# Patient Record
Sex: Female | Born: 1960 | Race: White | Hispanic: No | Smoking: Former smoker
Health system: Southern US, Community
[De-identification: ages and names within clinical notes are randomized; demographics above are authoritative.]

## PROBLEM LIST (undated history)

## (undated) DIAGNOSIS — E079 Disorder of thyroid, unspecified: Secondary | ICD-10-CM

## (undated) HISTORY — PX: TUBAL LIGATION: SHX77

## (undated) HISTORY — PX: CHOLECYSTECTOMY: SHX55

## (undated) HISTORY — PX: TONSILLECTOMY: SUR1361

---

## 1997-11-17 ENCOUNTER — Other Ambulatory Visit: Admission: RE | Admit: 1997-11-17 | Discharge: 1997-11-17 | Payer: Self-pay | Admitting: Obstetrics and Gynecology

## 2002-02-06 ENCOUNTER — Emergency Department (HOSPITAL_COMMUNITY): Admission: EM | Admit: 2002-02-06 | Discharge: 2002-02-06 | Payer: Self-pay | Admitting: *Deleted

## 2004-10-06 ENCOUNTER — Emergency Department: Payer: Self-pay | Admitting: Unknown Physician Specialty

## 2004-12-07 ENCOUNTER — Other Ambulatory Visit: Payer: Self-pay

## 2004-12-07 ENCOUNTER — Emergency Department: Payer: Self-pay | Admitting: General Practice

## 2010-02-25 ENCOUNTER — Emergency Department (HOSPITAL_COMMUNITY): Admission: EM | Admit: 2010-02-25 | Discharge: 2010-02-25 | Payer: Self-pay | Admitting: Internal Medicine

## 2010-03-15 ENCOUNTER — Ambulatory Visit (HOSPITAL_COMMUNITY): Admission: RE | Admit: 2010-03-15 | Discharge: 2010-03-15 | Payer: Self-pay | Admitting: Surgery

## 2010-08-04 LAB — DIFFERENTIAL
Basophils Relative: 1 % (ref 0–1)
Eosinophils Absolute: 0.2 10*3/uL (ref 0.0–0.7)
Monocytes Absolute: 0.5 10*3/uL (ref 0.1–1.0)
Monocytes Relative: 6 % (ref 3–12)

## 2010-08-04 LAB — BASIC METABOLIC PANEL
CO2: 28 mEq/L (ref 19–32)
Glucose, Bld: 93 mg/dL (ref 70–99)
Potassium: 3.7 mEq/L (ref 3.5–5.1)
Sodium: 137 mEq/L (ref 135–145)

## 2010-08-04 LAB — CBC
HCT: 31.5 % — ABNORMAL LOW (ref 36.0–46.0)
Hemoglobin: 11.8 g/dL — ABNORMAL LOW (ref 12.0–15.0)
Hemoglobin: 10.1 g/dL — ABNORMAL LOW (ref 12.0–15.0)
MCH: 26.4 pg (ref 26.0–34.0)
MCH: 25.8 pg — ABNORMAL LOW (ref 26.0–34.0)
MCHC: 32.1 g/dL (ref 30.0–36.0)
MCV: 79.2 fL (ref 78.0–100.0)
MCV: 80.6 fL (ref 78.0–100.0)
RBC: 4.49 MIL/uL (ref 3.87–5.11)

## 2010-10-08 NOTE — Consult Note (Signed)
NAME:  Olivia Peterson, GUARDIOLA NO.:  [id number]   MEDICAL RECORD NO.:  [id number]                   PATIENT TYPE:  EMS   LOCATION:  [facility]                                 FACILITY:  [facility]   PHYSICIAN:  Marlan Palau, M.D.               DATE OF BIRTH:  Nov 18, 1960   DATE OF CONSULTATION:  DATE OF DISCHARGE:                                   CONSULTATION   HISTORY OF PRESENT ILLNESS:  The patient is a 50 year old right handed white  female born May 17, 2061 with a history of obesity who comes into the emergency  room today with onset of left facial weakness that began yesterday. The  patient has noted loss of taste, feeling as if her tongue is wrapped in  plastic. The patient denies any ear pain but does have a bifrontal  headache. The patient has not had any alteration in hearing per se, has had  some slight worsening of facial weakness today, notes that it is difficult  to sip from a straw. The patient is concerned about her current deficits,  comes into the emergency room for an evaluation. Neurology is called.   History of present illness is notable for:  1. New onset of a left Bell's palsy that is relatively mild.  2. Obesity.  3. Bilateral tubal ligation.  4. Gallbladder resection in the past.   The patient is on Allegra for allergies and does not drink alcohol, smokes  five cigarettes a day, has no known allergies.   SOCIAL HISTORY:  The patient is married, has two children who are alive and  well, lives in the [place] area.   FAMILY HISTORY:  Notable for a mother who is alive with heart disease,  diabetes. Father is alive with hypertension. The patient has two brothers  and one sister. There is a very strong family history of heart disease  running in the family.   REVIEW OF SYSTEMS:  Notable for no fevers, chills. The patient denies  history of cold sores. Denies any double vision, loss of vision. Denies any  ear pain. Does have some ringing in  the ears at times. Denies neck pain,  shortness of breath, chest pain, cough, abdominal pain, nausea, vomiting.  Denies numbness or weakness on the face, arms, or legs or problems with  balance. Denies black out episodes.   PHYSICAL EXAMINATION:  VITAL SIGNS:  Blood pressure 144/79, heart rate 97,  respiratory rate 20, temperature afebrile.  GENERAL:  This patient is a moderately obese white female who is alert,  cooperative at the time of examination.  HEENT EXAMINATION:  Head is atraumatic. Eyes:  Pupils are equal, round, and  reactive to light. Discs are flat bilaterally.  NECK:  Supple, no carotid bruits noted.  RESPIRATORY EXAMINATION:  Clear.  CARDIOVASCULAR EXAMINATION:  Regular, rate, and rhythm. No obvious murmurs  or rubs noted.  EXTREMITIES:  Without significant edema.  NEUROLOGICAL EXAMINATION:  Cranial nerves as above. The patient does have  obvious but mild left facial weakness in the peripheral facial distribution,  decreased eye blink on the left. Extraocular movements are full. Visual  fields are full. Speech is well enunciated. The patient does have slight  asymmetry with smile, decreased on the left. Pinprick sensation on the face  is symmetric and normal. Tympanic membranes are clear bilaterally. Motor  testing reveals 5/5 strength in all fours. Good symmetric motor tone is  noted throughout. Sensory testing is intact to pinprick, soft touch, and  vibratory sensation throughout. The patient has good finger-nose, finger-toe-  to-finger bilaterally. No drift is seen. The patient is not ambulated. Deep  tendon reflexes again are symmetric and normal. Toes are downgoing  bilaterally.   LABORATORY DATA:  Notable for white count of 7.3, hemoglobin 11.2,  hematocrit 34.1, MCV 79.6, platelets 223. CT scan of the head was ordered  but was cancelled.   IMPRESSION:  Left sided Bell's palsy that is mild.   This patient has a very mild Bell's palsy. We will initiate  prednisone  therapy at this point.  The patient Hobdy return home today. I asked the  patient to watch for progression of her Bell's palsy, and if this occurs,  she Opie have to patch her eye on the left side at night time. At this point,  this patient is able to close her eye fairly well. The patient is to use  methylcellulose eye drops during the day to keep her eye wet. Will follow up  with this patient in about three weeks in the office. The patient is to  contact our office if any further problems arise.                                               Marlan Palau, M.D.    CKW/MEDQ  D:  02/06/2002  T:  02/08/2002  Job:  [id number]   cc:   [facility]  [address]

## 2012-10-10 ENCOUNTER — Emergency Department (HOSPITAL_COMMUNITY)
Admission: EM | Admit: 2012-10-10 | Discharge: 2012-10-10 | Disposition: A | Discharge status: Home / Self Care | Payer: Self-pay | Attending: Emergency Medicine | Admitting: Emergency Medicine

## 2012-10-10 ENCOUNTER — Encounter (HOSPITAL_COMMUNITY): Payer: Self-pay | Admitting: Family Medicine

## 2012-10-10 DIAGNOSIS — F17200 Nicotine dependence, unspecified, uncomplicated: Secondary | ICD-10-CM | POA: Insufficient documentation

## 2012-10-10 DIAGNOSIS — K0889 Other specified disorders of teeth and supporting structures: Secondary | ICD-10-CM

## 2012-10-10 DIAGNOSIS — K044 Acute apical periodontitis of pulpal origin: Secondary | ICD-10-CM | POA: Insufficient documentation

## 2012-10-10 DIAGNOSIS — K089 Disorder of teeth and supporting structures, unspecified: Secondary | ICD-10-CM | POA: Insufficient documentation

## 2012-10-10 MED ORDER — IBUPROFEN 800 MG PO TABS: 800.0000 mg | ORAL_TABLET | Freq: Three times a day (TID) | ORAL | Status: DC

## 2012-10-10 NOTE — ED Notes (Signed)
PA at bedside.

## 2012-10-10 NOTE — ED Notes (Signed)
Per pt sts tooth infection and pain. sts was given abx and pain meds but not better.

## 2012-10-10 NOTE — ED Notes (Signed)
Pt was seen at [facility] 5/15. Prescribed PCN and Vicodin. States has been taking PCN but "it's not working". States is out of Vicodin and cannot go to dentist until next month.

## 2012-10-10 NOTE — ED Notes (Signed)
Pt states pain relieved by injection.

## 2012-10-10 NOTE — ED Provider Notes (Signed)
History    This chart was scribed for non-physician practitioner, Roxy Horseman PA-C working with Vida Roller, MD by Donne Anon, ED Scribe. This patient was seen in room TR05C/TR05C and the patient's care was started at 1712.   CSN: [id number]  Arrival date & time 10/10/12  1620   First MD Initiated Contact with Patient 10/10/12 1712      Chief Complaint  Patient presents with  . Dental Pain     The history is provided by the patient. No language interpreter was used.   HPI Comments: Olivia Peterson is a 52 y.o. female who presents to the Emergency Department complaining of 11 days of gradual onset, gradually worsening, constant dental pain that is worse at night. She states she was seen at [facility] on 10/04/12 and diagnosed with a tooth infection. She has tried Penicillin and Vicodin with little relief.    History reviewed. No pertinent past medical history.  Past Surgical History  Procedure Laterality Date  . Cholecystectomy    . Tubal ligation    . Tonsillectomy      History reviewed. No pertinent family history.  History  Substance Use Topics  . Smoking status: Current Every Day Smoker  . Smokeless tobacco: Not on file  . Alcohol Use: No    OB History   Grav Para Term Preterm Abortions TAB SAB Ect Mult Living                  Review of Systems  Constitutional: Negative for fever.  HENT: Positive for dental problem.   All other systems reviewed and are negative.    Allergies  Review of patient's allergies indicates no known allergies.  Home Medications  No current outpatient prescriptions on file.  BP 144/86  Pulse 88  Temp(Src) 98.3 F (36.8 C)  Resp 18  SpO2 97%  LMP 10/07/2012  Physical Exam  Nursing note and vitals reviewed. Constitutional: She is oriented to person, place, and time. She appears well-developed and well-nourished. No distress.  HENT:  Head: Normocephalic and atraumatic.  Mouth/Throat:    Poor dentition  throughout.  Affected tooth as diagrammed.  No signs of peritonsillar or tonsillar abscess.  No signs of gingival abscess. Oropharynx is clear and without exudates.  Uvula is midline.  Airway is intact. No signs of Ludwig's angina.   Eyes: EOM are normal.  Neck: Neck supple. No tracheal deviation present.  Cardiovascular: Normal rate.   Pulmonary/Chest: Effort normal. No respiratory distress.  Musculoskeletal: Normal range of motion.  Neurological: She is alert and oriented to person, place, and time.  Skin: Skin is warm and dry.  Psychiatric: She has a normal mood and affect. Her behavior is normal.    ED Course  Dental Date/Time: 10/10/2012 11:49 PM Performed by: Roxy Horseman Authorized by: Roxy Horseman Consent: Verbal consent obtained. Risks and benefits: risks, benefits and alternatives were discussed Consent given by: patient Patient understanding: patient states understanding of the procedure being performed Patient consent: the patient's understanding of the procedure matches consent given Procedure consent: procedure consent matches procedure scheduled Relevant documents: relevant documents present and verified Test results: test results available and properly labeled Imaging studies: imaging studies available Required items: required blood products, implants, devices, and special equipment available Patient identity confirmed: verbally with patient Preparation: Patient was prepped and draped in the usual sterile fashion. Local anesthesia used: yes Local anesthetic: bupivacaine 0.25% with epinephrine Anesthetic total: 1.8 ml Patient sedated: no Patient tolerance: Patient tolerated the  procedure well with no immediate complications.   (including critical care time) DIAGNOSTIC STUDIES: Oxygen Saturation is 97% on room air, adequate by my interpretation.    COORDINATION OF CARE: 6:02 PM Discussed treatment plan which includes dental block with pt at bedside and pt  agreed to plan. Advised pt to follow up with dentist. Will give resource. Discussed that she is already taking pain medication and antibiotics so I will not prescribe any additional medications.  6:10 PM Rechecked pt. She reports she is improving with the dental block.   Labs Reviewed - No data to display No results found.   1. Pain, dental       MDM  Patient with uncomplicated dental pain. Will treat with pain medicine and antibiotics. Will give dental referral    I personally performed the services described in this documentation, which was scribed in my presence. The recorded information has been reviewed and is accurate.       Roxy Horseman, PA-C 10/10/12 2350

## 2012-10-11 NOTE — ED Provider Notes (Signed)
Medical screening examination/treatment/procedure(s) were performed by non-physician practitioner and as supervising physician I was immediately available for consultation/collaboration.    Champ Keetch D Nathanal Hermiz, MD 10/11/12 1350 

## 2017-11-13 ENCOUNTER — Encounter (HOSPITAL_COMMUNITY): Payer: Self-pay | Admitting: Emergency Medicine

## 2017-11-13 ENCOUNTER — Other Ambulatory Visit: Payer: Self-pay

## 2017-11-13 ENCOUNTER — Emergency Department (HOSPITAL_COMMUNITY): Payer: Self-pay

## 2017-11-13 ENCOUNTER — Emergency Department (HOSPITAL_COMMUNITY)
Admission: EM | Admit: 2017-11-13 | Discharge: 2017-11-14 | Disposition: A | Discharge status: Home / Self Care | Payer: Self-pay | Attending: Emergency Medicine | Admitting: Emergency Medicine

## 2017-11-13 DIAGNOSIS — R0789 Other chest pain: Secondary | ICD-10-CM | POA: Insufficient documentation

## 2017-11-13 DIAGNOSIS — F17200 Nicotine dependence, unspecified, uncomplicated: Secondary | ICD-10-CM | POA: Insufficient documentation

## 2017-11-13 DIAGNOSIS — Z79899 Other long term (current) drug therapy: Secondary | ICD-10-CM | POA: Insufficient documentation

## 2017-11-13 LAB — BASIC METABOLIC PANEL
ANION GAP: 13 (ref 5–15)
BUN: 14 mg/dL (ref 6–20)
CALCIUM: 9.6 mg/dL (ref 8.9–10.3)
CO2: 25 mmol/L (ref 22–32)
Chloride: 101 mmol/L (ref 101–111)
Creatinine, Ser: 1 mg/dL (ref 0.44–1.00)
GFR calc Af Amer: >60 mL/min (ref >60)
GFR calc non Af Amer: >60 mL/min (ref >60)
Glucose, Bld: 134 mg/dL — ABNORMAL HIGH (ref 65–99)
POTASSIUM: 3.5 mmol/L (ref 3.5–5.1)
SODIUM: 139 mmol/L (ref 135–145)

## 2017-11-13 LAB — I-STAT TROPONIN, ED: TROPONIN I, POC: 0 ng/mL (ref 0.00–0.08)

## 2017-11-13 LAB — CBC
HEMATOCRIT: 42.5 % (ref 36.0–46.0)
HEMOGLOBIN: 13.4 g/dL (ref 12.0–15.0)
MCH: 26.8 pg (ref 26.0–34.0)
MCHC: 31.5 g/dL (ref 30.0–36.0)
MCV: 85 fL (ref 78.0–100.0)
Platelets: 316 10*3/uL (ref 150–400)
RBC: 5 MIL/uL (ref 3.87–5.11)
RDW: 14 % (ref 11.5–15.5)
WBC: 13.4 10*3/uL — AB (ref 4.0–10.5)

## 2017-11-13 LAB — I-STAT BETA HCG BLOOD, ED (MC, WL, AP ONLY)

## 2017-11-13 MED ORDER — KETOROLAC TROMETHAMINE 30 MG/ML IJ SOLN
30.0000 mg | Freq: Once | INTRAMUSCULAR | Status: DC
  Filled 2017-11-13: qty 1

## 2017-11-13 NOTE — ED Triage Notes (Signed)
Pt states a central chest pain radiating to the right beast and into the back since last night. Pain currently 0/10. Pt endorses nausea and shortness of breath.

## 2017-11-13 NOTE — ED Provider Notes (Addendum)
[facility] EMERGENCY DEPARTMENT Provider Note   CSN: [id number] Arrival date & time: 11/13/17  1824     History   Chief Complaint Chief Complaint  Patient presents with  . Chest Pain    HPI Olivia Peterson is a 57 y.o. female.  HPI  This is a 57 year old female who presents with chest pain.  Patient reports onset of sharp and pressure-like chest pain that radiated into her left breast and back.  Onset of symptoms was last night when she was at work.  She states that it was intermittent.  Episodes lasted approximately 1 minute.  She was relating during the episodes.  She states over the last 2 to 3 months she has had similar episodes intermittently but nothing as bad as last night.  Her last episode of chest pain was just prior to arrival.  She currently is chest pain-free.  She denies any shortness of breath, cough, fevers.  She does report chills.  Patient reports also recent history of "cramping" for which she has seen her primary physician.  Currently she is complaining of back cramping.  Patient denies any history of blood clots, leg swelling, leg pain, recent travel, recent hospitalization.  He is a current smoker.  History reviewed. No pertinent past medical history.  There are no active problems to display for this patient.   Past Surgical History:  Procedure Laterality Date  . CHOLECYSTECTOMY    . TONSILLECTOMY    . TUBAL LIGATION       OB History   None      Home Medications    Prior to Admission medications   Medication Sig Start Date End Date Taking? Authorizing Provider  HYDROcodone-acetaminophen (NORCO/VICODIN) 5-325 MG per tablet Take 1 tablet by mouth every 4 (four) hours as needed for pain. For pain    [provider]  ibuprofen (ADVIL,MOTRIN) 800 MG tablet Take 1 tablet (800 mg total) by mouth 3 (three) times daily. 11/14/17   Gottfried Standish, Barbette Hair, MD  naproxen sodium (ANAPROX) 220 MG tablet Take 660-880 mg by mouth daily as  needed. For pain    [provider]  penicillin v potassium (VEETID) 500 MG tablet Take 500 mg by mouth 3 (three) times daily.    [provider]    Family History No family history on file.  Social History Social History   Tobacco Use  . Smoking status: Current Every Day Smoker  Substance Use Topics  . Alcohol use: No  . Drug use: Not on file     Allergies   Patient has no known allergies.   Review of Systems Review of Systems  Constitutional: Positive for chills. Negative for fever.  Respiratory: Negative for shortness of breath.   Cardiovascular: Positive for chest pain. Negative for leg swelling.  Gastrointestinal: Negative for abdominal pain, nausea and vomiting.  Musculoskeletal:       Ramping  Neurological: Negative for numbness.  All other systems reviewed and are negative.    Physical Exam Updated Vital Signs BP 111/63   Pulse 73   Temp 98.2 F (36.8 C)   Resp 15   Ht 5\' 5"  (1.651 m)   Wt 106.6 kg (235 lb)   LMP 11/06/2017   SpO2 99%   BMI 39.11 kg/m   Physical Exam  Constitutional: She is oriented to person, place, and time. She appears well-developed and well-nourished. She does not appear ill.  HENT:  Head: Normocephalic and atraumatic.  Eyes: Pupils are equal,  round, and reactive to light.  Cardiovascular: Normal rate, regular rhythm, normal heart sounds and normal pulses.  Anterior chest wall tenderness to palpation, no crepitus or overlying skin changes  Pulmonary/Chest: Effort normal. No respiratory distress. She has no wheezes.  Abdominal: Soft. Bowel sounds are normal. There is no tenderness.  Musculoskeletal:       Right lower leg: She exhibits no tenderness and no edema.       Left lower leg: She exhibits no tenderness and no edema.  Neurological: She is alert and oriented to person, place, and time.  Skin: Skin is warm and dry.  Psychiatric: She has a normal mood and affect.  Nursing note and vitals  reviewed.    ED Treatments / Results  Labs (all labs ordered are listed, but only abnormal results are displayed) Labs Reviewed  BASIC METABOLIC PANEL - Abnormal; Notable for the following components:      Result Value   Glucose, Bld 134 (*)    All other components within normal limits  CBC - Abnormal; Notable for the following components:   WBC 13.4 (*)    All other components within normal limits  D-DIMER, QUANTITATIVE (NOT AT [facility])  I-STAT TROPONIN, ED  I-STAT BETA HCG BLOOD, ED ([facility], [facility], [facility] ONLY)  I-STAT TROPONIN, ED    EKG EKG Interpretation  Date/Time:  Monday November 13 2017 18:32:26 EDT Ventricular Rate:  102 PR Interval:  114 QRS Duration: 74 QT Interval:  316 QTC Calculation: 411 R Axis:   51 Text Interpretation:  Sinus tachycardia Otherwise normal ECG Confirmed by Thayer Jew [phone]) on 11/13/2017 11:11:46 PM   Radiology Dg Chest 2 View  Result Date: 11/13/2017 CLINICAL DATA:  Chest pain EXAM: CHEST - 2 VIEW COMPARISON:  Chest x-ray dated 09/09/2012. FINDINGS: Cardiomediastinal silhouette is within normal limits in size and configuration. Lungs are clear. Lung volumes are normal. No evidence of pneumonia. No pleural effusion. No pneumothorax seen. Osseous and soft tissue structures about the chest are unremarkable. IMPRESSION: No active cardiopulmonary disease. Electronically Signed   By: Franki Cabot M.D.   On: 11/13/2017 19:12    Procedures Procedures (including critical care time)  Medications Ordered in ED Medications  ketorolac (TORADOL) 30 MG/ML injection 30 mg (30 mg Intramuscular Given 11/14/17 0105)     Initial Impression / Assessment and Plan / ED Course  I have reviewed the triage vital signs and the nursing notes.  Pertinent labs & imaging results that were available during my care of the patient were reviewed by me and considered in my medical decision making (see chart for details).     Patient presents with chest pain.  Somewhat  atypical; however patient does have some risk factors for ACS including obesity, age, and smoking history.  Her EKG is nonischemic.  She is mildly tachycardic.  Otherwise her vital signs are reassuring.  Considerations include ACS, PE, pneumonia although she denies any infectious symptoms.  Less likely dissection given reassuring exam and no neurologic symptoms.  She also has a reproducible component on exam which would suggest musculoskeletal.  Patient is currently without pain.  She is reporting some cramping.  Patient was given IM Toradol.  Initial troponin is negative.  D-dimer sent given tachycardia.  D-dimer is negative.  Repeat troponin is also negative.  Chest x-ray shows no evidence of pneumothorax or pneumonia.  On recheck, patient states that she feels much better.  Given her risk factors, we will have her follow-up with cardiology as an outpatient  for stress testing.  Recommend anti-inflammatories in the meantime.  After history, exam, and medical workup I feel the patient has been appropriately medically screened and is safe for discharge home. Pertinent diagnoses were discussed with the patient. Patient was given return precautions.   Final Clinical Impressions(s) / ED Diagnoses   Final diagnoses:  Atypical chest pain    ED Discharge Orders        Ordered    ibuprofen (ADVIL,MOTRIN) 800 MG tablet  3 times daily     11/14/17 0140       Ashanty Coltrane, Barbette Hair, MD 11/14/17 2820    Merryl Hacker, MD 11/14/17 [phone]

## 2017-11-14 LAB — D-DIMER, QUANTITATIVE (NOT AT ARMC)

## 2017-11-14 LAB — I-STAT TROPONIN, ED: TROPONIN I, POC: 0.01 ng/mL (ref 0.00–0.08)

## 2017-11-14 MED ORDER — IBUPROFEN 800 MG PO TABS: 800.0000 mg | ORAL_TABLET | Freq: Three times a day (TID) | ORAL | 0 refills | Status: AC

## 2017-11-14 MED ORDER — KETOROLAC TROMETHAMINE 30 MG/ML IJ SOLN
30.0000 mg | Freq: Once | INTRAMUSCULAR | Status: AC
  Administered 2017-11-14: 30 mg via INTRAMUSCULAR

## 2017-11-14 MED ORDER — NAPROXEN 250 MG PO TABS
500.0000 mg | ORAL_TABLET | Freq: Once | ORAL | Status: AC
  Administered 2017-11-14: 500 mg via ORAL
  Filled 2017-11-14: qty 2

## 2017-11-14 NOTE — Discharge Instructions (Addendum)
You were seen today for chest pain.  Your work-up is largely reassuring.  Follow-up with cardiology for stress testing.  In the meantime take ibuprofen as needed for pain or discomfort.  If you develop any new or worsening symptoms you should be reevaluated immediately.

## 2017-11-14 NOTE — ED Notes (Signed)
On d/c, pt experienced severe leg cramp, Dr Dina Rich informed, stated d/c could continue following naproxen

## 2020-06-09 ENCOUNTER — Emergency Department
Admission: EM | Admit: 2020-06-09 | Discharge: 2020-06-09 | Disposition: A | Discharge status: Home / Self Care | Payer: No Typology Code available for payment source | Attending: Emergency Medicine | Admitting: Emergency Medicine

## 2020-06-09 ENCOUNTER — Emergency Department: Payer: No Typology Code available for payment source

## 2020-06-09 ENCOUNTER — Encounter: Payer: Self-pay | Admitting: Emergency Medicine

## 2020-06-09 ENCOUNTER — Other Ambulatory Visit: Payer: Self-pay

## 2020-06-09 DIAGNOSIS — W010XXA Fall on same level from slipping, tripping and stumbling without subsequent striking against object, initial encounter: Secondary | ICD-10-CM | POA: Diagnosis not present

## 2020-06-09 DIAGNOSIS — D259 Leiomyoma of uterus, unspecified: Secondary | ICD-10-CM | POA: Diagnosis not present

## 2020-06-09 DIAGNOSIS — M5416 Radiculopathy, lumbar region: Secondary | ICD-10-CM | POA: Diagnosis not present

## 2020-06-09 DIAGNOSIS — W19XXXA Unspecified fall, initial encounter: Secondary | ICD-10-CM

## 2020-06-09 DIAGNOSIS — Y990 Civilian activity done for income or pay: Secondary | ICD-10-CM | POA: Diagnosis not present

## 2020-06-09 DIAGNOSIS — Z87891 Personal history of nicotine dependence: Secondary | ICD-10-CM | POA: Diagnosis not present

## 2020-06-09 DIAGNOSIS — S92355A Nondisplaced fracture of fifth metatarsal bone, left foot, initial encounter for closed fracture: Secondary | ICD-10-CM | POA: Insufficient documentation

## 2020-06-09 DIAGNOSIS — S99922A Unspecified injury of left foot, initial encounter: Secondary | ICD-10-CM | POA: Diagnosis present

## 2020-06-09 DIAGNOSIS — M79671 Pain in right foot: Secondary | ICD-10-CM | POA: Insufficient documentation

## 2020-06-09 DIAGNOSIS — M25561 Pain in right knee: Secondary | ICD-10-CM | POA: Insufficient documentation

## 2020-06-09 HISTORY — DX: Disorder of thyroid, unspecified: E07.9

## 2020-06-09 LAB — CBG MONITORING, ED: Glucose-Capillary: 98 mg/dL (ref 70–99)

## 2020-06-09 MED ORDER — ONDANSETRON HCL 4 MG/2ML IJ SOLN
4.0000 mg | Freq: Once | INTRAMUSCULAR | Status: AC
  Administered 2020-06-09: 4 mg via INTRAVENOUS
  Filled 2020-06-09: qty 2

## 2020-06-09 MED ORDER — FENTANYL CITRATE (PF) 100 MCG/2ML IJ SOLN
50.0000 ug | INTRAMUSCULAR | Status: AC | PRN
  Administered 2020-06-09: 50 ug via INTRAVENOUS

## 2020-06-09 MED ORDER — ONDANSETRON 4 MG PO TBDP: 4.0000 mg | ORAL_TABLET | Freq: Three times a day (TID) | ORAL | 0 refills | Status: AC | PRN

## 2020-06-09 MED ORDER — DEXAMETHASONE SODIUM PHOSPHATE 10 MG/ML IJ SOLN
10.0000 mg | Freq: Once | INTRAMUSCULAR | Status: DC
  Filled 2020-06-09: qty 1

## 2020-06-09 MED ORDER — OXYCODONE HCL 5 MG PO TABS: 5.0000 mg | ORAL_TABLET | Freq: Four times a day (QID) | ORAL | 0 refills | Status: AC | PRN

## 2020-06-09 MED ORDER — IBUPROFEN 600 MG PO TABS
600.0000 mg | ORAL_TABLET | Freq: Once | ORAL | Status: AC
  Administered 2020-06-09: 600 mg via ORAL
  Filled 2020-06-09: qty 1

## 2020-06-09 MED ORDER — MORPHINE SULFATE (PF) 4 MG/ML IV SOLN: 4.0000 mg | Freq: Once | INTRAVENOUS | Status: DC

## 2020-06-09 MED ORDER — NAPROXEN 375 MG PO TABS: 375.0000 mg | ORAL_TABLET | Freq: Two times a day (BID) | ORAL | 0 refills | Status: AC

## 2020-06-09 MED ORDER — FENTANYL CITRATE (PF) 100 MCG/2ML IJ SOLN
INTRAMUSCULAR | Status: AC
  Administered 2020-06-09: 50 ug via INTRAVENOUS
  Filled 2020-06-09: qty 2

## 2020-06-09 MED ORDER — PREDNISONE 20 MG PO TABS: 40.0000 mg | ORAL_TABLET | Freq: Every day | ORAL | 0 refills | Status: AC

## 2020-06-09 MED ORDER — ONDANSETRON 4 MG PO TBDP
4.0000 mg | ORAL_TABLET | Freq: Once | ORAL | Status: AC
Start: 2020-06-09 — End: 2020-06-09
  Administered 2020-06-09: 4 mg via ORAL
  Filled 2020-06-09: qty 1

## 2020-06-09 MED ORDER — HYDROMORPHONE HCL 1 MG/ML IJ SOLN
0.5000 mg | Freq: Once | INTRAMUSCULAR | Status: DC
Start: 2020-06-09 — End: 2020-06-10
  Filled 2020-06-09: qty 1

## 2020-06-09 MED ORDER — OXYCODONE-ACETAMINOPHEN 5-325 MG PO TABS
2.0000 | ORAL_TABLET | Freq: Once | ORAL | Status: AC
  Administered 2020-06-09: 2 via ORAL
  Filled 2020-06-09: qty 2

## 2020-06-09 NOTE — ED Triage Notes (Signed)
Presents via EMS s/p fall  States she slipped  Landed on both knees  Having increased pain to right knee  Was given 75 mcg of fentanyl

## 2020-06-09 NOTE — ED Notes (Signed)
CBG checked due to patient c/o feeling "shaky" and having hypoglycemia. WNL at this time.

## 2020-06-09 NOTE — ED Notes (Signed)
Pt's employer requests UDS for [facility], per verbal request by pt's supervisor Elmo Putt.  Consent forms signed by pt, specimen collected by this tech and released to lab via chain of custody protocol.  Specimen ID no [id number]

## 2020-06-09 NOTE — ED Notes (Signed)
Says fell at work this am landing on both knees.  Says right knee hurts more than left.  Had stubbed toes too.  Has open area on tip of big right toe.  Also has pain in left wrist.  Good circulation to extremities.

## 2020-06-09 NOTE — ED Notes (Signed)
Awaiting tech assist for legal urine collection.

## 2020-06-09 NOTE — Discharge Instructions (Signed)
For your knee: You can apply ice tonight Elevate your right leg above the level of your heart as often as possible  For your foot: Wear the walking boot until follow-up XRays in 7-10 days  For your back: Take the pain and anti-inflammatory medications No heavy lifting until cleared I'd recommend seeing a spine specialist if symptoms do not improve after medications

## 2020-06-09 NOTE — ED Notes (Signed)
Pt provided juice, approved by Dr. Ellender Hose. Pt refused medications at this time.

## 2020-06-09 NOTE — ED Notes (Signed)
ED Provider at bedside. 

## 2020-06-09 NOTE — ED Provider Notes (Signed)
[facility] Emergency Department Provider Note  ____________________________________________   Event Date/Time   First MD Initiated Contact with Patient 06/09/20 1617     (approximate)  I have reviewed the triage vital signs and the nursing notes.   HISTORY  Chief Complaint Fall    HPI Olivia Peterson is a 60 y.o. female  Here with fall. Pt was at work today when she slipped on a patch of ice. Reports she tried to speed up her steps to catch her balance but was unable to do so. She fell onto her bilateral knees, then back. Reports no head injury or LOC. She has since had 10/10 aching, sharp, R knee pain along with R ankle and midline lower back pain. Pain is worse w/ weightbearing, movement. No alleviating factors. No open wounds. No blood thinner use. No syncope. No alleviating factors other than rest. She has not taken anything.        Past Medical History:  Diagnosis Date  . Thyroid disease     There are no problems to display for this patient.   Past Surgical History:  Procedure Laterality Date  . CHOLECYSTECTOMY    . TONSILLECTOMY    . TUBAL LIGATION      Prior to Admission medications   Medication Sig Start Date End Date Taking? Authorizing Provider  levothyroxine (SYNTHROID) 25 MCG tablet Take 25 mcg by mouth daily before breakfast.   Yes [provider]  naproxen (NAPROSYN) 375 MG tablet Take 1 tablet (375 mg total) by mouth 2 (two) times daily with a meal for 7 days. 06/09/20 06/16/20 Yes Duffy Bruce, MD  ondansetron (ZOFRAN ODT) 4 MG disintegrating tablet Take 1 tablet (4 mg total) by mouth every 8 (eight) hours as needed for nausea or vomiting. 06/09/20  Yes Duffy Bruce, MD  oxyCODONE (ROXICODONE) 5 MG immediate release tablet Take 1-2 tablets (5-10 mg total) by mouth every 6 (six) hours as needed for moderate pain or severe pain (no more than 6 tabs daily). 06/09/20 06/09/21 Yes Duffy Bruce, MD  predniSONE (DELTASONE) 20  MG tablet Take 2 tablets (40 mg total) by mouth daily for 5 days. 06/09/20 06/14/20 Yes Duffy Bruce, MD  HYDROcodone-acetaminophen (NORCO/VICODIN) 5-325 MG per tablet Take 1 tablet by mouth every 4 (four) hours as needed for pain. For pain    [provider]  ibuprofen (ADVIL,MOTRIN) 800 MG tablet Take 1 tablet (800 mg total) by mouth 3 (three) times daily. 11/14/17   Horton, Barbette Hair, MD  naproxen sodium (ANAPROX) 220 MG tablet Take 660-880 mg by mouth daily as needed. For pain    [provider]  penicillin v potassium (VEETID) 500 MG tablet Take 500 mg by mouth 3 (three) times daily.    [provider]    Allergies Patient has no known allergies.  No family history on file.  Social History Social History   Tobacco Use  . Smoking status: Former Research scientist (life sciences)  . Smokeless tobacco: Never Used  . Tobacco comment: quit in 2020  Substance Use Topics  . Alcohol use: No    Review of Systems  Review of Systems  Constitutional: Negative for fatigue and fever.  HENT: Negative for congestion and sore throat.   Eyes: Negative for visual disturbance.  Respiratory: Negative for cough and shortness of breath.   Cardiovascular: Negative for chest pain.  Gastrointestinal: Negative for abdominal pain, diarrhea, nausea and vomiting.  Genitourinary: Negative for flank pain.  Musculoskeletal: Positive for arthralgias, back pain  and joint swelling. Negative for neck pain.  Skin: Negative for rash and wound.  Neurological: Negative for weakness.     ____________________________________________  PHYSICAL EXAM:      VITAL SIGNS: ED Triage Vitals  Enc Vitals Group     BP 06/09/20 1235 122/82     Pulse Rate 06/09/20 1235 83     Resp 06/09/20 1235 18     Temp 06/09/20 1235 98.5 F (36.9 C)     Temp Source 06/09/20 1235 Oral     SpO2 06/09/20 1235 95 %     Weight 06/09/20 1217 233 lb 11 oz (106 kg)     Height 06/09/20 1217 5\' 5"  (1.651 m)     Head Circumference --       Peak Flow --      Pain Score 06/09/20 1235 8     Pain Loc --      Pain Edu? --      Excl. in [place]? --      Physical Exam Vitals and nursing note reviewed.  Constitutional:      General: She is not in acute distress.    Appearance: She is well-developed and well-nourished.  HENT:     Head: Normocephalic and atraumatic.  Eyes:     Conjunctiva/sclera: Conjunctivae normal.  Cardiovascular:     Rate and Rhythm: Normal rate and regular rhythm.     Heart sounds: Normal heart sounds.  Pulmonary:     Effort: Pulmonary effort is normal. No respiratory distress.     Breath sounds: No wheezing.  Abdominal:     General: There is no distension.  Musculoskeletal:        General: No edema.     Cervical back: Neck supple.     Comments: Midline lower lumbar back pain. No midline deformity. Left knee with moderate TTP over anterior knee, no bruising, no rashes or skin lesions.  Skin:    General: Skin is warm.     Capillary Refill: Capillary refill takes less than 2 seconds.     Findings: No rash.  Neurological:     Mental Status: She is alert and oriented to person, place, and time.     Motor: No abnormal muscle tone.      LOWER EXTREMITY EXAM: RIGHT  INSPECTION & PALPATION: Marked TTP over R anterior knee, with tenderness upon any pROM. Small knee effusion. Moderate TTP over posterior ankle. Avulsion of distal nail of first toe, no underlying lac or nailbed injury.  SENSORY: sensation is intact to light touch in:  Superficial peroneal nerve distribution (over dorsum of foot) Deep peroneal nerve distribution (over first dorsal web space) Sural nerve distribution (over lateral aspect 5th metatarsal) Saphenous nerve distribution (over medial instep)  MOTOR:  + Motor EHL (great toe dorsiflexion) + FHL (great toe plantar flexion)  + TA (ankle dorsiflexion)  + GSC (ankle plantar flexion)  VASCULAR: 2+ dorsalis pedis and posterior tibialis pulses Capillary refill < 2 sec, toes warm  and well-perfused  COMPARTMENTS: Soft, warm, well-perfused No pain with passive extension No parethesias    ____________________________________________   LABS (all labs ordered are listed, but only abnormal results are displayed)  Labs Reviewed  CBG MONITORING, ED    ____________________________________________  EKG:  ________________________________________  RADIOLOGY All imaging, including plain films, CT scans, and ultrasounds, independently reviewed by me, and interpretations confirmed via formal radiology reads.  ED MD interpretation:   XR Knee Left: Tricompartmental arthritis, no fx XR Knee Right: Arthritis, no fx  CT L Spine: Negative for fx, R disc protrusion, possible uterine mass CT Knee Right: Negative US TV: Uterine fibroid, no adnexal mass/lesions  Official radiology report(s): DG Ankle Complete Right  Result Date: 06/09/2020 CLINICAL DATA:  Fall EXAM: RIGHT ANKLE - COMPLETE 3+ VIEW COMPARISON:  None. FINDINGS: Ankle mortise is symmetric. There are degenerative changes medially and laterally. Moderate plantar calcaneal spur. Possible fracture deformity base of fifth metatarsal. IMPRESSION: Possible fracture deformity base of fifth metatarsal. Correlate for point tenderness to the region. Recommend dedicated right foot radiographs. Electronically Signed   By: Donavan Foil M.D.   On: 06/09/2020 17:43   CT Lumbar Spine Wo Contrast  Result Date: 06/09/2020 CLINICAL DATA:  Initial evaluation for acute trauma, fall. EXAM: CT LUMBAR SPINE WITHOUT CONTRAST TECHNIQUE: Multidetector CT imaging of the lumbar spine was performed without intravenous contrast administration. Multiplanar CT image reconstructions were also generated. COMPARISON:  None available. FINDINGS: Segmentation: Standard. Lowest well-formed disc space labeled the L5-S1 level. Alignment: Trace levoscoliosis with apex at L3-4. Alignment otherwise normal preservation of the normal lumbar lordosis. No  listhesis. Vertebrae: Vertebral body height maintained without acute or chronic fracture. Visualized sacrum and pelvis intact. SI joints approximated symmetric. No discrete or worrisome osseous lesions. Paraspinal and other soft tissues: Paraspinous soft tissues demonstrate no acute finding. Mild to moderate aorto bi-iliac atherosclerotic disease. No aneurysm. 2.5 cm simple cyst present at the interpolar right kidney. 6 mm nonobstructive right renal nephrolithiasis noted. Prior cholecystectomy. There is partial visualization of a soft tissue lesion within the left adnexa (series 5, image 123). While this finding could reflect an enlarged fibroid uterus that is somewhat deviated to the left, the possibility of a left adnexal mass is difficult to exclude. Disc levels: L1-2:  Unremarkable. L2-3:  Unremarkable. L3-4: Mild disc bulge with facet hypertrophy. No spinal stenosis. Foramina remain patent. L4-5: Minimal disc bulge. Mild facet hypertrophy. No canal or foraminal stenosis. L5-S1: Small right foraminal disc protrusion closely approximates the exiting right L5 nerve root without frank impingement (series 5, image 98). Moderate right with mild left facet hypertrophy. No spinal stenosis. Foramina remain patent. IMPRESSION: 1. No acute traumatic injury within the lumbar spine. 2. Small right foraminal disc protrusion at L5-S1 closely approximates the exiting right L5 nerve root without frank impingement. 3. Partial visualization of a soft tissue lesion within the left adnexa. While this finding could reflect an enlarged fibroid uterus that is somewhat deviated to the left, the possibility of a left adnexal mass is difficult to exclude. Further evaluation with cross-sectional imaging of the abdomen and pelvis and/or pelvic ultrasound suggested for further evaluation. 4. 6 mm nonobstructive right renal nephrolithiasis. 5. Aortic Atherosclerosis (ICD10-I70.0). Electronically Signed   By: Jeannine Boga M.D.   On:  06/09/2020 19:31   CT Knee Right Wo Contrast  Result Date: 06/09/2020 CLINICAL DATA:  Recent fall with knee pain, initial encounter EXAM: CT OF THE RIGHT KNEE WITHOUT CONTRAST TECHNIQUE: Multidetector CT imaging of the right knee was performed according to the standard protocol. Multiplanar CT image reconstructions were also generated. COMPARISON:  Plain film from earlier in the same day. FINDINGS: Bones/Joint/Cartilage Degenerative changes are noted most marked in the medial joint space with mild subchondral sclerosis and joint space narrowing identified. Mild lateral joint space narrowing in the patellofemoral space is noted. No acute fracture or dislocation is noted. Ligaments Suboptimally assessed by CT. No definitive ligamentous injury is noted. Muscles and Tendons Surrounding musculature appears within normal limits. Soft tissues No joint effusion  is identified.  No subcutaneous hematoma is seen. IMPRESSION: No acute fracture noted. Multifocal degenerative changes seen with back seen on prior plain film examination. No acute soft tissue abnormality is seen. Electronically Signed   By: Inez Catalina M.D.   On: 06/09/2020 18:13   US Pelvis Complete  Result Date: 06/09/2020 CLINICAL DATA:  Soft tissue lesion seen on CT EXAM: TRANSABDOMINAL ULTRASOUND OF PELVIS TECHNIQUE: Transabdominal ultrasound examination of the pelvis was performed including evaluation of the uterus, ovaries, adnexal regions, and pelvic cul-de-sac. COMPARISON:  CT same day FINDINGS: Uterus Measurements: 13.7 x 7.0 x 7.2 cm = volume: 359 mL. There is a partially calcified submucosal uterine fibroid extending into the endometrial canal measuring 5.1 x 4.6 x 4.4 cm Endometrium Obscured due to the submucosal fibroid Right ovary Measurements: 2.9 x 1.1 x 1.2 cm = volume: 2.1 mL. Normal appearance/no adnexal mass. Left ovary Measurements: 2.6 x 1.3 x 1.7 cm = volume: 3.0 mL. Normal appearance/no adnexal mass. Other findings:  No abnormal  free fluid. IMPRESSION: Large submucosal partially calcified uterine fibroid which obscures the endometrial canal. Electronically Signed   By: Prudencio Pair M.D.   On: 06/09/2020 21:01   DG Knee Complete 4 Views Left  Result Date: 06/09/2020 CLINICAL DATA:  Pain, status post fall with pain on both knees. EXAM: LEFT KNEE - COMPLETE 4+ VIEW COMPARISON:  RIGHT knee of the same date. FINDINGS: Soft tissues are unremarkable.  No sign of joint effusion. 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 No sign of fracture or dislocation. Moderate to marked joint space narrowing in the medial compartment with marginal osteophytes. 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 Electronically Signed   By: Zetta Bills M.D.   On: 06/09/2020 13:49   DG Knee Complete 4 Views Right  Result Date: 06/09/2020 CLINICAL DATA:  Pain, injury, slip and fall EXAM: RIGHT KNEE - COMPLETE 4+ VIEW COMPARISON:  None. FINDINGS: No fracture or dislocation of the right knee. There is mild medial and patellofemoral compartment joint space loss and osteophytosis, with a preserved lateral compartment. No knee joint effusion. Soft tissues are unremarkable. IMPRESSION: 1.  No fracture or dislocation of the right knee. 2.  Mild medial and patellofemoral compartment arthrosis. Electronically Signed   By: Eddie Candle M.D.   On: 06/09/2020 13:56   DG Foot Complete Right  Result Date: 06/09/2020 CLINICAL DATA:  Abnormal ankle radiograph bruising and swelling to right foot EXAM: RIGHT FOOT COMPLETE - 3+ VIEW COMPARISON:  Ankle radiograph 06/09/2020 FINDINGS: Possible small acute fracture at the base of fifth metatarsal. No subluxation. Moderate plantar calcaneal spur. IMPRESSION: Possible small acute fracture at the base of the fifth metatarsal. Electronically Signed   By: Donavan Foil M.D.   On:  06/09/2020 19:14    ____________________________________________  PROCEDURES   Procedure(s) performed (including Critical Care):  Procedures  ____________________________________________  INITIAL IMPRESSION / MDM / [place] / ED COURSE  As part of my medical decision making, I reviewed the following data within the [place] notes reviewed and incorporated, Old chart reviewed, Notes from prior ED visits, and [place] Controlled Substance Database       *Olivia Peterson was evaluated in Emergency Department on 06/09/2020 for the symptoms described in the history of present illness. She was evaluated in the context of the global COVID-19 pandemic, which necessitated consideration that the patient might be at risk  for infection with the SARS-CoV-2 virus that causes COVID-19. Institutional protocols and algorithms that pertain to the evaluation of patients at risk for COVID-19 are in a state of rapid change based on information released by regulatory bodies including the CDC and federal and state organizations. These policies and algorithms were followed during the patient's care in the ED.  Some ED evaluations and interventions [place] be delayed as a result of limited staffing during the pandemic.*     Medical Decision Making: 60 yo F here with multiple areas of pain after mechanical fall.  No head injury or loss of consciousness.  Plain films show possible nondisplaced fracture of the fifth metatarsal, will place in a cam walker with outpatient follow-up.  Otherwise, plain films of the knees negative.  Given her exquisite tenderness, CT of the knee as well as lumbar spine obtained, reviewed, and showed no acute fracture.  She does have possible disc herniation of the lumbar spine which could explain some of her pain, with possible acute component as well.  Will give her anti-inflammatories, analgesics, as well as prednisone for possible radiculopathy.  Of note,  incidental note made of possible ovarian mass on lumbar spine and imaging was recommended.  Ultrasound obtained which shows fibroid without complications.  She is notified of this and will follow-up with her PCP.  No lower extremity weakness, numbness, or signs of cauda equina.  No other signs of complication.  ____________________________________________  FINAL CLINICAL IMPRESSION(S) / ED DIAGNOSES  Final diagnoses:  Right foot pain  Lumbar radiculopathy  Nondisplaced fracture of fifth metatarsal bone, left foot, initial encounter for closed fracture  Uterine leiomyoma, unspecified location  Fall, initial encounter     MEDICATIONS GIVEN DURING THIS VISIT:  Medications  HYDROmorphone (DILAUDID) injection 0.5 mg (0.5 mg Intravenous Not Given 06/09/20 2139)  dexamethasone (DECADRON) injection 10 mg (10 mg Intravenous Not Given 06/09/20 2139)  fentaNYL (SUBLIMAZE) injection 50 mcg (50 mcg Intravenous Given 06/09/20 1343)  oxyCODONE-acetaminophen (PERCOCET/ROXICET) 5-325 MG per tablet 2 tablet (2 tablets Oral Given 06/09/20 1656)  ondansetron (ZOFRAN-ODT) disintegrating tablet 4 mg (4 mg Oral Given 06/09/20 1656)  ibuprofen (ADVIL) tablet 600 mg (600 mg Oral Given 06/09/20 1657)  ondansetron (ZOFRAN) injection 4 mg (4 mg Intravenous Given 06/09/20 2130)     ED Discharge Orders         Ordered    oxyCODONE (ROXICODONE) 5 MG immediate release tablet  Every 6 hours PRN        06/09/20 2132    naproxen (NAPROSYN) 375 MG tablet  2 times daily with meals        06/09/20 2132    ondansetron (ZOFRAN ODT) 4 MG disintegrating tablet  Every 8 hours PRN        06/09/20 2132    predniSONE (DELTASONE) 20 MG tablet  Daily        06/09/20 2132           Note:  This document was prepared using Dragon voice recognition software and [place] include unintentional dictation errors.   Duffy Bruce, MD 06/09/20 2244

## 2022-12-08 IMAGING — US US PELVIS COMPLETE
1 series · 14 of 25 positions shown · non-contrast
Comparison: CT same day

CLINICAL DATA: Soft tissue lesion seen on CT

EXAM:
TRANSABDOMINAL ULTRASOUND OF PELVIS
TECHNIQUE: Transabdominal ultrasound examination of the pelvis was performed
including evaluation of the uterus, ovaries, adnexal regions, and
pelvic cul-de-sac.

[Series 1: us pelvis (transabdominal only) · 14 of 40 slices shown]
[im 1/40]
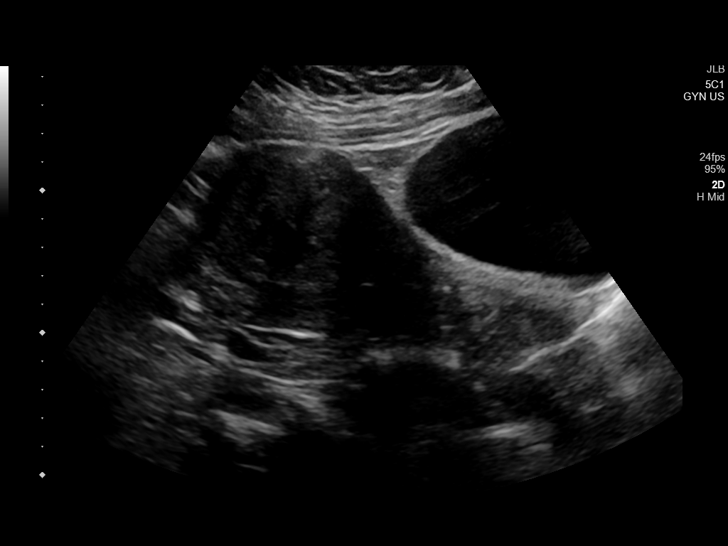
[im 4/40]
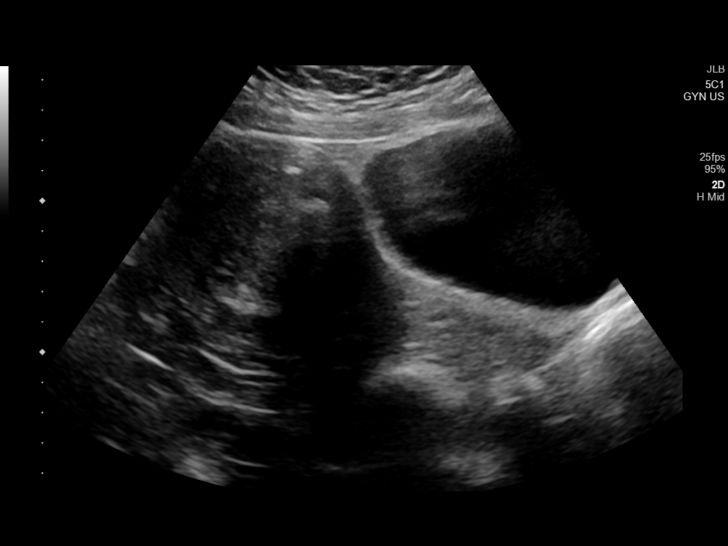
[im 7/40]
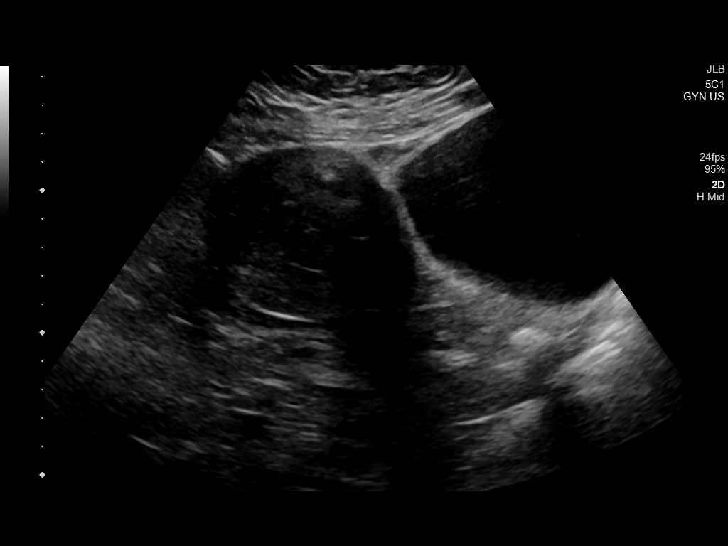
[im 10/40]
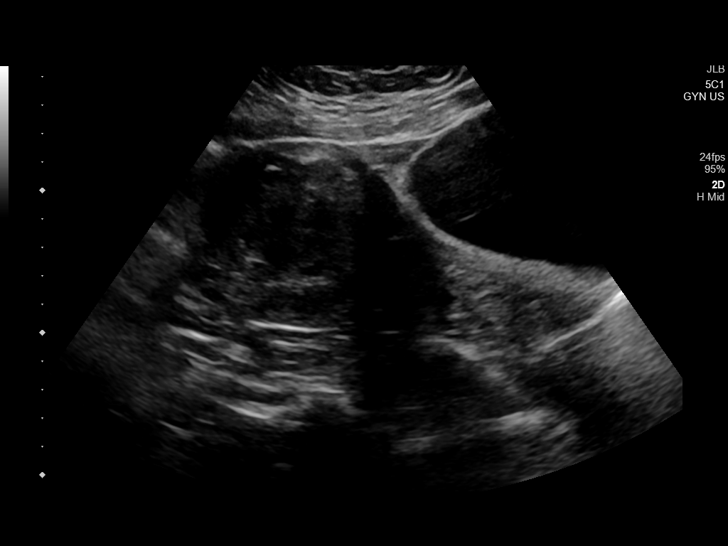
[im 14/40]
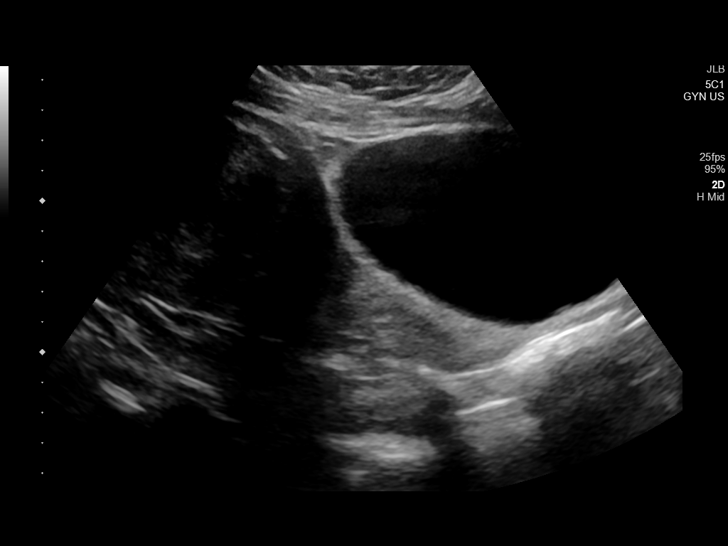
[im 15/40]
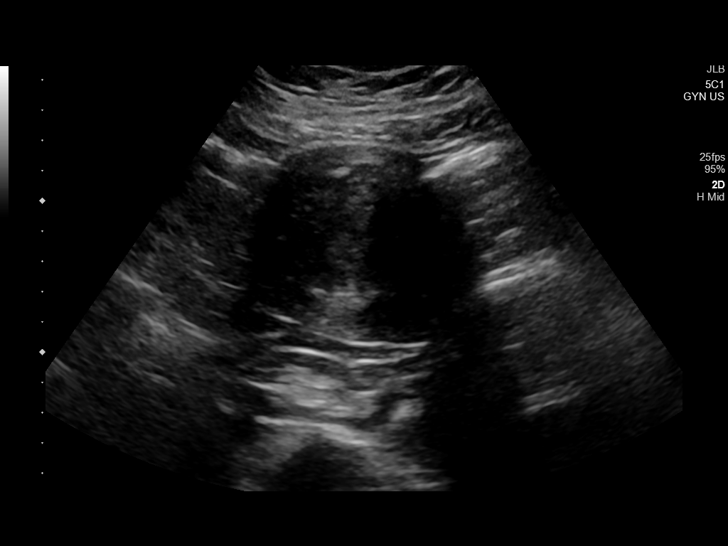
[im 18/40]
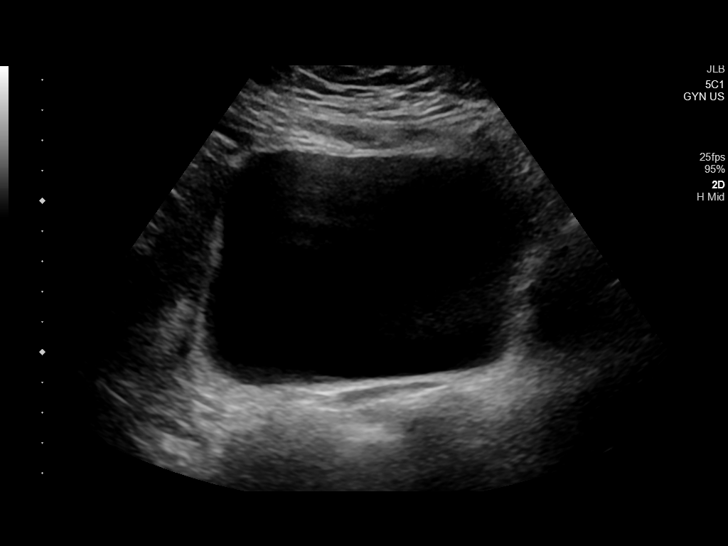
[im 22/40]
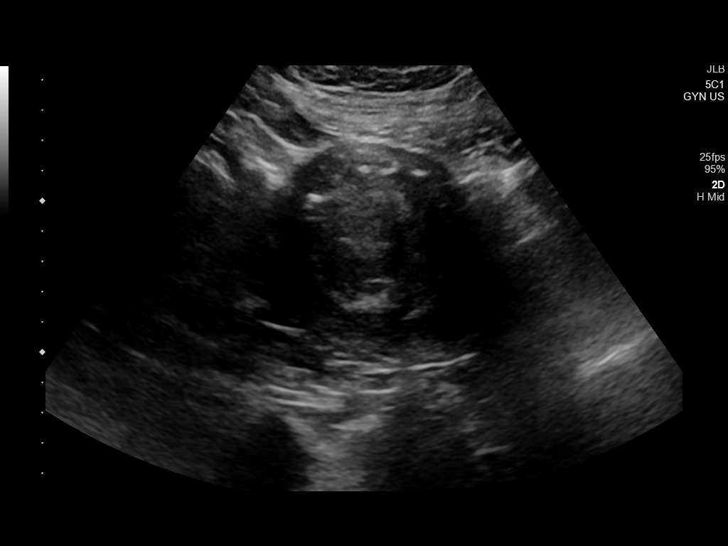
[im 25/40]
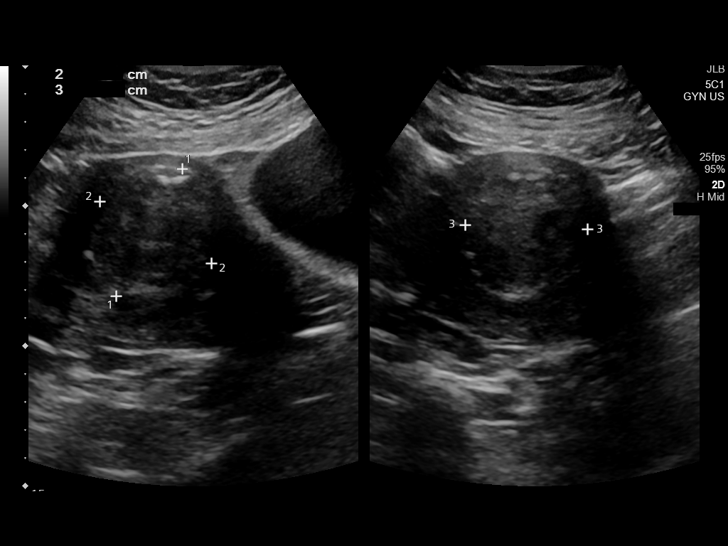
[im 27/40]
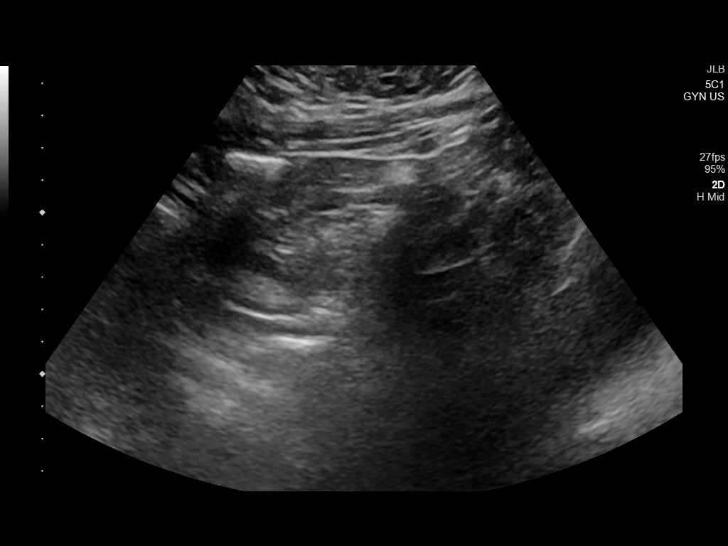
[im 30/40]
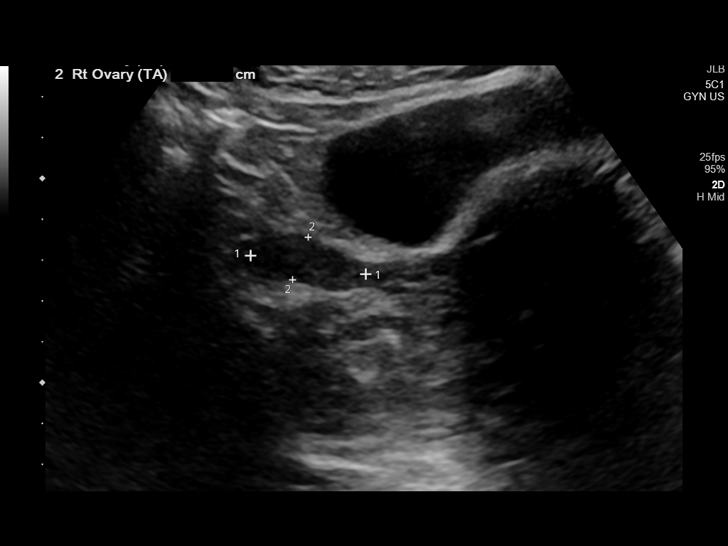
[im 33/40]
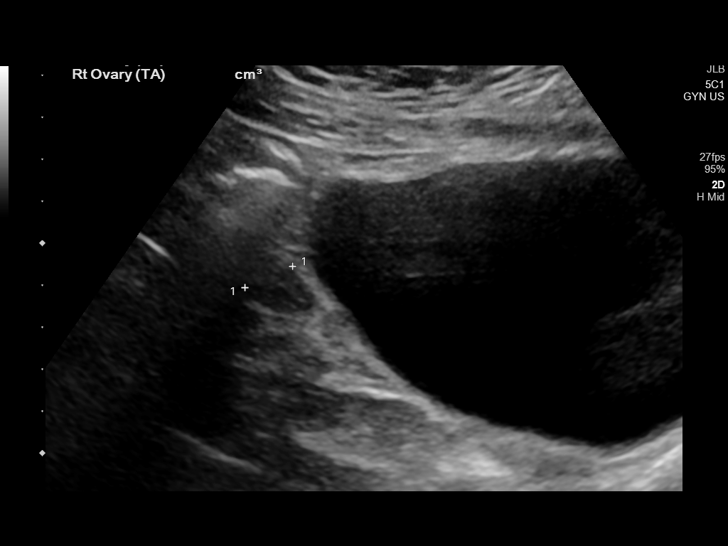
[im 36/40]
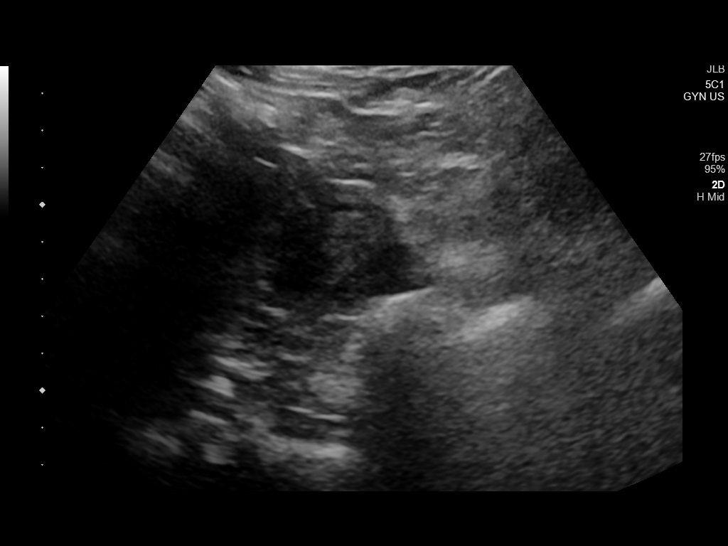
[im 40/40]
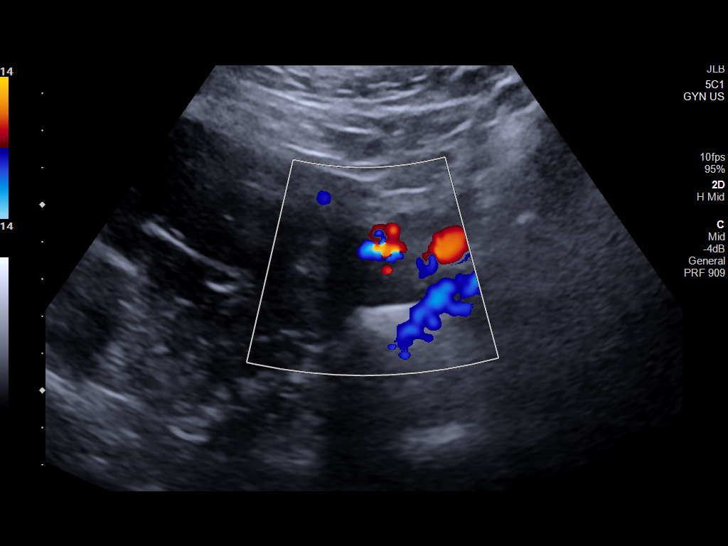

[14 of 25 positions shown; findings below may reference images not displayed]

FINDINGS: Uterus

Measurements: 13.7 x 7.0 x 7.2 cm = volume: 359 mL. There is a
partially calcified submucosal uterine fibroid extending into the
endometrial canal measuring 5.1 x 4.6 x 4.4 cm

Endometrium

Obscured due to the submucosal fibroid

Right ovary

Measurements: 2.9 x 1.1 x 1.2 cm = volume: 2.1 mL. Normal
appearance/no adnexal mass.

Left ovary

Measurements: 2.6 x 1.3 x 1.7 cm = volume: 3.0 mL. Normal
appearance/no adnexal mass.

Other findings:  No abnormal free fluid.
IMPRESSION: Large submucosal partially calcified uterine fibroid which obscures
the endometrial canal.

## 2022-12-08 IMAGING — DX DG FOOT COMPLETE 3+V*R*
3 series · 3 of 3 positions shown · non-contrast
Comparison: Ankle radiograph 06/09/2020

CLINICAL DATA: Abnormal ankle radiograph bruising and swelling to
right foot

EXAM:
RIGHT FOOT COMPLETE - 3+ VIEW

[foot ap]
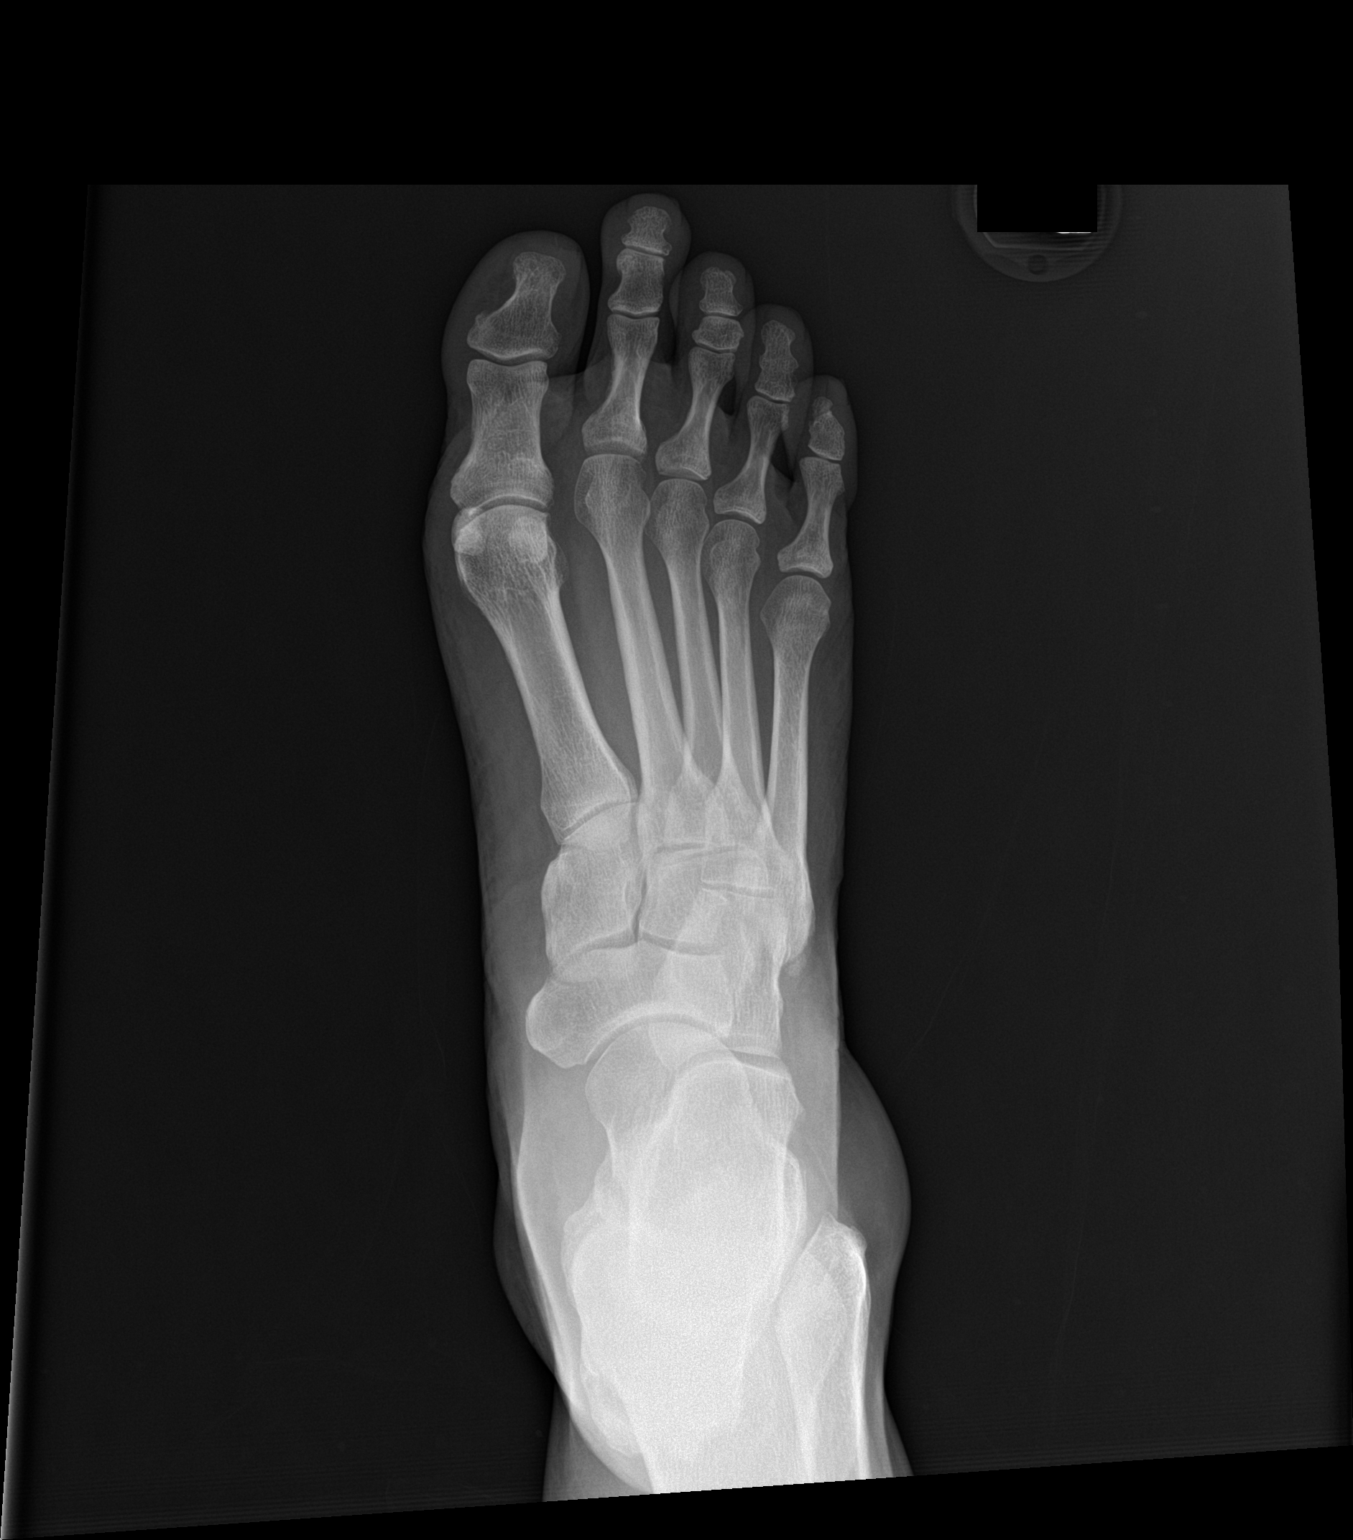

[foot obl]
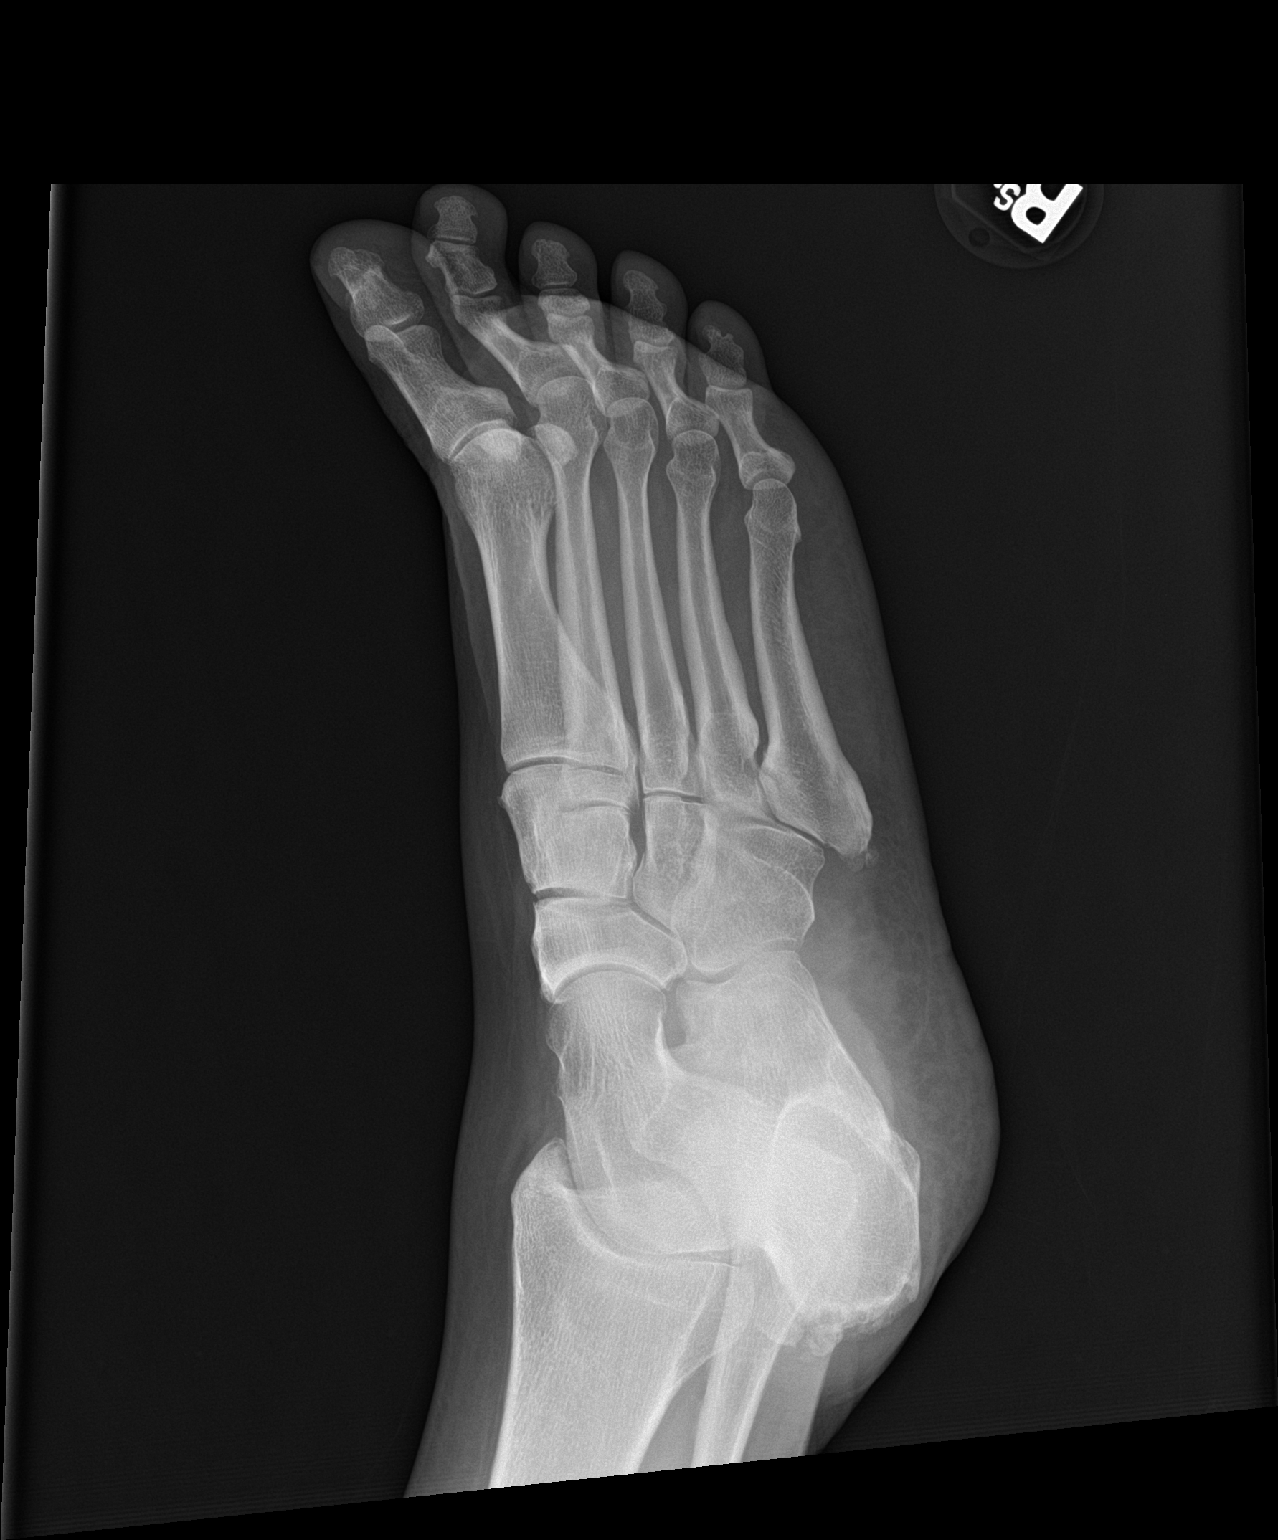

[foot lat]
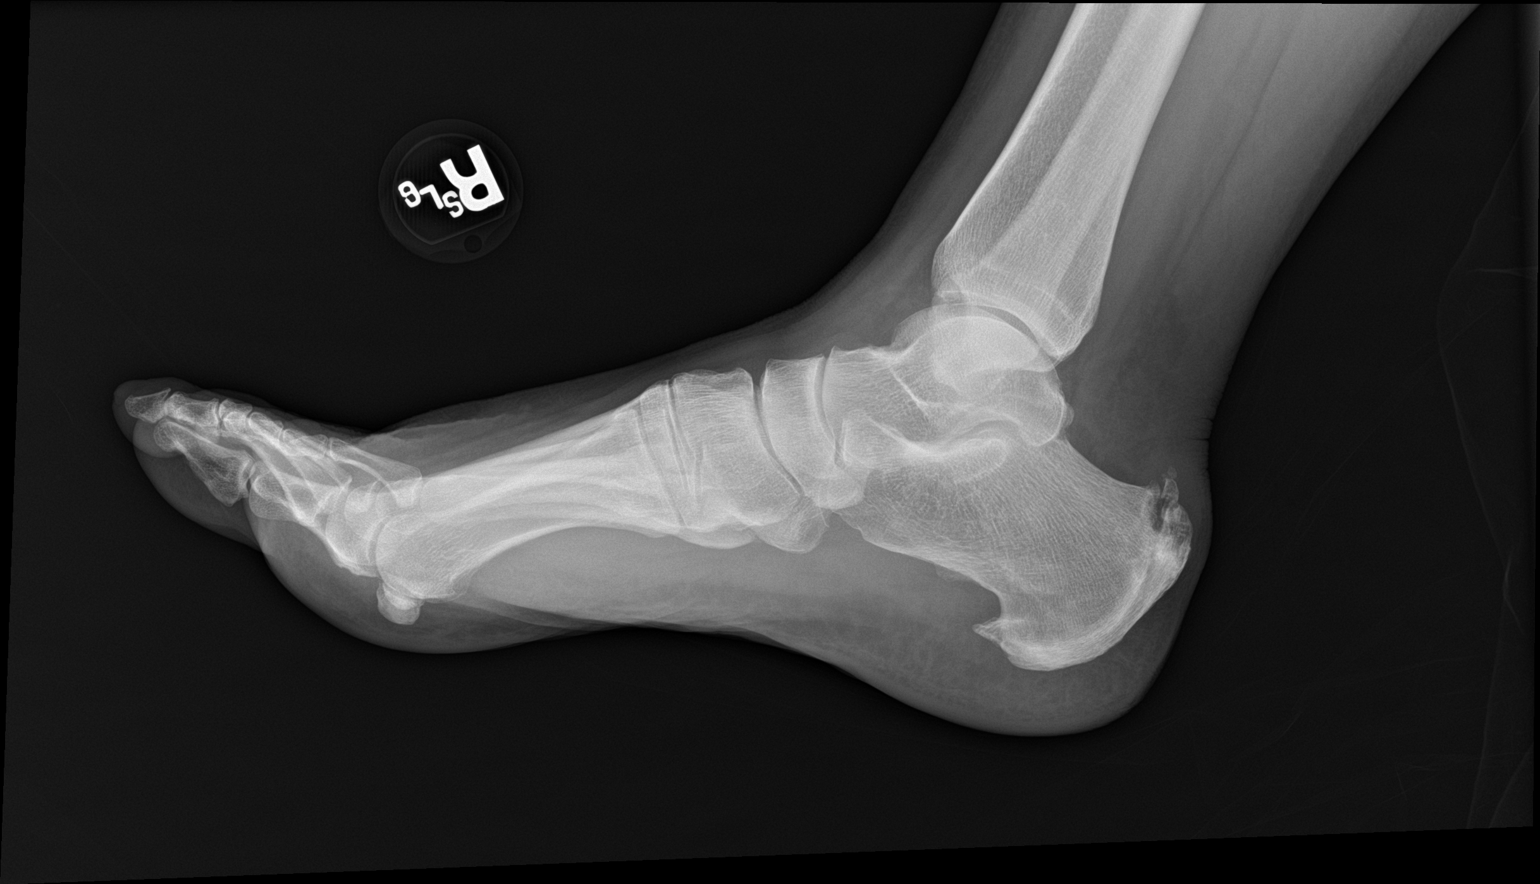

[3 of 3 positions shown; findings below may reference images not displayed]

FINDINGS: Possible small acute fracture at the base of fifth metatarsal. No
subluxation. Moderate plantar calcaneal spur.
IMPRESSION: Possible small acute fracture at the base of the fifth metatarsal.
